# Patient Record
Sex: Male | Born: 1985 | Race: Black or African American | Hispanic: No | Marital: Single | Smoking: Current every day smoker
Health system: Southern US, Community
[De-identification: ages and names within clinical notes are randomized; demographics above are authoritative.]

---
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
## 2013-10-14 ENCOUNTER — Emergency Department: Payer: Self-pay | Admitting: Internal Medicine

## 2013-12-22 ENCOUNTER — Emergency Department: Payer: Self-pay | Admitting: Emergency Medicine

## 2013-12-22 LAB — BASIC METABOLIC PANEL
ANION GAP: 7 (ref 7–16)
BUN: 14 mg/dL (ref 7–18)
CALCIUM: 8.7 mg/dL (ref 8.5–10.1)
CO2: 26 mmol/L (ref 21–32)
CREATININE: 1.14 mg/dL (ref 0.60–1.30)
Chloride: 107 mmol/L (ref 98–107)
EGFR (Non-African Amer.): >60
GLUCOSE: 92 mg/dL (ref 65–99)
Osmolality: 280 (ref 275–301)
Potassium: 3.8 mmol/L (ref 3.5–5.1)
Sodium: 140 mmol/L (ref 136–145)

## 2013-12-22 LAB — CBC
HCT: 44.5 %
HGB: 15.2 g/dL
MCH: 28.9 pg
MCHC: 34.1 g/dL
MCV: 85 fL
Platelet: 240 x10 3/mm 3
RBC: 5.24 x10 6/mm 3
RDW: 13.7 %
WBC: 10.2 x10 3/mm 3

## 2013-12-22 LAB — ETHANOL: Ethanol: 150 mg/dL

## 2014-04-26 ENCOUNTER — Emergency Department: Payer: Self-pay | Admitting: Emergency Medicine

## 2016-01-24 ENCOUNTER — Emergency Department: Payer: Self-pay

## 2016-01-24 ENCOUNTER — Emergency Department
Admission: EM | Admit: 2016-01-24 | Discharge: 2016-01-24 | Disposition: A | Discharge status: Home / Self Care | Payer: Self-pay | Attending: Emergency Medicine | Admitting: Emergency Medicine

## 2016-01-24 ENCOUNTER — Encounter: Payer: Self-pay | Admitting: Emergency Medicine

## 2016-01-24 DIAGNOSIS — R079 Chest pain, unspecified: Secondary | ICD-10-CM | POA: Insufficient documentation

## 2016-01-24 DIAGNOSIS — M545 Low back pain: Secondary | ICD-10-CM | POA: Insufficient documentation

## 2016-01-24 DIAGNOSIS — F17200 Nicotine dependence, unspecified, uncomplicated: Secondary | ICD-10-CM | POA: Insufficient documentation

## 2016-01-24 LAB — BASIC METABOLIC PANEL
Anion gap: 9 (ref 5–15)
BUN: 16 mg/dL (ref 6–20)
CO2: 24 mmol/L (ref 22–32)
Calcium: 9.8 mg/dL (ref 8.9–10.3)
Chloride: 104 mmol/L (ref 101–111)
Creatinine, Ser: 0.96 mg/dL (ref 0.61–1.24)
GFR calc Af Amer: >60 mL/min (ref >60)
GLUCOSE: 109 mg/dL — AB (ref 65–99)
Potassium: 3.6 mmol/L (ref 3.5–5.1)
SODIUM: 137 mmol/L (ref 135–145)

## 2016-01-24 LAB — CBC
HEMATOCRIT: 45.9 % (ref 40.0–52.0)
HEMOGLOBIN: 15.4 g/dL (ref 13.0–18.0)
MCH: 29.2 pg (ref 26.0–34.0)
MCHC: 33.6 g/dL (ref 32.0–36.0)
MCV: 86.7 fL (ref 80.0–100.0)
Platelets: 224 10*3/uL (ref 150–440)
RBC: 5.29 MIL/uL (ref 4.40–5.90)
RDW: 13.6 % (ref 11.5–14.5)
WBC: 8.2 10*3/uL (ref 3.8–10.6)

## 2016-01-24 LAB — TROPONIN I: Troponin I: <0.03 ng/mL (ref <0.03)

## 2016-01-24 MED ORDER — RANITIDINE HCL 150 MG PO TABS: 150.0000 mg | ORAL_TABLET | Freq: Two times a day (BID) | ORAL | 1 refills | Status: AC

## 2016-01-24 NOTE — ED Provider Notes (Signed)
[facility] Emergency Department Provider Note   ____________________________________________   I have reviewed the triage vital signs and the nursing notes.   HISTORY  Chief Complaint Chest Pain and Dizziness   History limited by: Not Limited   HPI Rodney Peck is a 30 y.o. male who presents to the emergency department today with main complaint of chest pain. He describes it as sharp stabbing. It is located in the central chest. It is intermittent. He has not noticed any pattern to it. It is not accompanied by any shortness of breath or diaphoresis. In addition the patient has complaints of high blood pressure. This is also been going on for the past week. He denies any history of high blood pressure. Lastly he complains of right lower back pain. This started today. Denies any lifting today although states he does lift for his work.    History reviewed. No pertinent past medical history.  There are no active problems to display for this patient.   History reviewed. No pertinent surgical history.  Prior to Admission medications   Not on File    Allergies Review of patient's allergies indicates no known allergies.  History reviewed. No pertinent family history.  Social History Social History  Substance Use Topics  . Smoking status: Current Every Day Smoker  . Smokeless tobacco: Never Used  . Alcohol use Yes    Review of Systems  Constitutional: Negative for fever. Cardiovascular: Positive for chest pain. Respiratory: Negative for shortness of breath. Gastrointestinal: Negative for abdominal pain, vomiting and diarrhea. Genitourinary: Negative for dysuria. Musculoskeletal: Positive for right lower back. Skin: Negative for rash. Neurological: Negative for headaches, focal weakness or numbness.   10-point ROS otherwise negative.  ____________________________________________   PHYSICAL EXAM:  VITAL SIGNS: ED Triage Vitals  [01/24/16 2022]  Enc Vitals Group     BP (!) 146/86     Pulse Rate 93     Resp 16     Temp 98.4 F (36.9 C)     Temp Source Oral     SpO2 100 %     Weight 225 lb (102.1 kg)     Height 6\' 1"  (1.854 m)   Constitutional: Alert and oriented. Well appearing and in no distress. Eyes: Conjunctivae are normal. Normal extraocular movements. ENT   Head: Normocephalic and atraumatic.   Nose: No congestion/rhinnorhea.   Mouth/Throat: Mucous membranes are moist.   Neck: No stridor. Hematological/Lymphatic/Immunilogical: No cervical lymphadenopathy. Cardiovascular: Normal rate, regular rhythm.  No murmurs, rubs, or gallops. Respiratory: Normal respiratory effort without tachypnea nor retractions. Breath sounds are clear and equal bilaterally. No wheezes/rales/rhonchi. Gastrointestinal: Soft and nontender. No distention.  Genitourinary: Deferred Musculoskeletal: Normal range of motion in all extremities. No lower extremity edema. Neurologic:  Normal speech and language. No gross focal neurologic deficits are appreciated.  Skin:  Skin is warm, dry and intact. No rash noted. Psychiatric: Mood and affect are normal. Speech and behavior are normal. Patient exhibits appropriate insight and judgment.  ____________________________________________    LABS (pertinent positives/negatives)  Labs Reviewed  BASIC METABOLIC PANEL - Abnormal; Notable for the following:       Result Value   Glucose, Bld 109 (*)    All other components within normal limits  CBC  TROPONIN I     ____________________________________________   EKG  I, Phineas Semen, attending physician, personally viewed and interpreted this EKG  EKG Time: 2033 Rate: 82 Rhythm: normal sinus rhythm Axis: normal Intervals: qtc 371 QRS: narrow ST  changes: no st elevation Impression: normal ekg  ____________________________________________    RADIOLOGY  CXR   IMPRESSION:  Normal chest.      ____________________________________________   PROCEDURES  Procedures  ____________________________________________   INITIAL IMPRESSION / ASSESSMENT AND PLAN / ED COURSE  Pertinent labs & imaging results that were available during my care of the patient were reviewed by me and considered in my medical decision making (see chart for details).  Patient presented to the emergency department today because of concerns for intermittent chest pain primarily. EKG chest x-ray and blood work without any concerning findings per patient did state that he isfood and drink alcohol. I do wonder if part of this could be acid reflux. Will give patient prescription for an acids. In terms of the right lower back pain. I think this likely muscular skeletal strain. Discussed care with patient. ____________________________________________   FINAL CLINICAL IMPRESSION(S) / ED DIAGNOSES  Final diagnoses:  Nonspecific chest pain     Note: This dictation was prepared with Dragon dictation. Any transcriptional errors that result from this process are unintentional    Phineas SemenGraydon Mckinzie Saksa, MD 01/24/16 2304

## 2016-01-24 NOTE — Discharge Instructions (Signed)
Please seek medical attention for any high fevers, chest pain, shortness of breath, change in behavior, persistent vomiting, bloody stool or any other new or concerning symptoms.  

## 2016-01-24 NOTE — ED Triage Notes (Addendum)
Pt ambulatory to triage with steady gait, no distress noted. Pt c/o shooting chest pain, lightheaded and lower back pain x2 days. Pt reports he has checked his BP for the past 2 days and has been hypertensive (159/95). Pt denies chest pain at this time. Pt denies cardiac/hypertension HX.

## 2016-06-26 ENCOUNTER — Emergency Department
Admission: EM | Admit: 2016-06-26 | Discharge: 2016-06-26 | Disposition: A | Discharge status: Home / Self Care | Payer: Self-pay | Attending: Emergency Medicine | Admitting: Emergency Medicine

## 2016-06-26 ENCOUNTER — Encounter: Payer: Self-pay | Admitting: *Deleted

## 2016-06-26 DIAGNOSIS — Y929 Unspecified place or not applicable: Secondary | ICD-10-CM | POA: Insufficient documentation

## 2016-06-26 DIAGNOSIS — Y9389 Activity, other specified: Secondary | ICD-10-CM | POA: Insufficient documentation

## 2016-06-26 DIAGNOSIS — S21111A Laceration without foreign body of right front wall of thorax without penetration into thoracic cavity, initial encounter: Secondary | ICD-10-CM | POA: Insufficient documentation

## 2016-06-26 DIAGNOSIS — Y999 Unspecified external cause status: Secondary | ICD-10-CM | POA: Insufficient documentation

## 2016-06-26 DIAGNOSIS — W503XXA Accidental bite by another person, initial encounter: Secondary | ICD-10-CM

## 2016-06-26 DIAGNOSIS — F17200 Nicotine dependence, unspecified, uncomplicated: Secondary | ICD-10-CM | POA: Insufficient documentation

## 2016-06-26 DIAGNOSIS — Z23 Encounter for immunization: Secondary | ICD-10-CM | POA: Insufficient documentation

## 2016-06-26 DIAGNOSIS — Z79899 Other long term (current) drug therapy: Secondary | ICD-10-CM | POA: Insufficient documentation

## 2016-06-26 MED ORDER — TETANUS-DIPHTHERIA TOXOIDS TD 5-2 LFU IM INJ
0.5000 mL | INJECTION | Freq: Once | INTRAMUSCULAR | Status: DC
  Filled 2016-06-26: qty 0.5

## 2016-06-26 MED ORDER — AMOXICILLIN-POT CLAVULANATE 875-125 MG PO TABS: 1.0000 | ORAL_TABLET | Freq: Two times a day (BID) | ORAL | 0 refills | Status: AC

## 2016-06-26 MED ORDER — TETANUS-DIPHTH-ACELL PERTUSSIS 5-2.5-18.5 LF-MCG/0.5 IM SUSP
0.5000 mL | Freq: Once | INTRAMUSCULAR | Status: AC
  Administered 2016-06-26: 0.5 mL via INTRAMUSCULAR
  Filled 2016-06-26: qty 0.5
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
## 2017-06-10 ENCOUNTER — Emergency Department: Payer: BLUE CROSS/BLUE SHIELD

## 2017-06-10 ENCOUNTER — Emergency Department
Admission: EM | Admit: 2017-06-10 | Discharge: 2017-06-10 | Disposition: A | Discharge status: Home / Self Care | Payer: BLUE CROSS/BLUE SHIELD | Attending: Emergency Medicine | Admitting: Emergency Medicine

## 2017-06-10 ENCOUNTER — Encounter: Payer: Self-pay | Admitting: Emergency Medicine

## 2017-06-10 DIAGNOSIS — Y929 Unspecified place or not applicable: Secondary | ICD-10-CM | POA: Insufficient documentation

## 2017-06-10 DIAGNOSIS — Y9383 Activity, rough housing and horseplay: Secondary | ICD-10-CM | POA: Diagnosis not present

## 2017-06-10 DIAGNOSIS — X500XXA Overexertion from strenuous movement or load, initial encounter: Secondary | ICD-10-CM | POA: Diagnosis not present

## 2017-06-10 DIAGNOSIS — S63279A Dislocation of unspecified interphalangeal joint of unspecified finger, initial encounter: Secondary | ICD-10-CM

## 2017-06-10 DIAGNOSIS — S6991XA Unspecified injury of right wrist, hand and finger(s), initial encounter: Secondary | ICD-10-CM | POA: Diagnosis present

## 2017-06-10 DIAGNOSIS — Y999 Unspecified external cause status: Secondary | ICD-10-CM | POA: Diagnosis not present

## 2017-06-10 DIAGNOSIS — F17200 Nicotine dependence, unspecified, uncomplicated: Secondary | ICD-10-CM | POA: Insufficient documentation

## 2017-06-10 MED ORDER — NAPROXEN 500 MG PO TABS: 500.0000 mg | ORAL_TABLET | Freq: Two times a day (BID) | ORAL | 0 refills | Status: AC

## 2017-06-10 MED ORDER — OXYCODONE HCL 5 MG PO TABS
5.0000 mg | ORAL_TABLET | Freq: Once | ORAL | Status: AC
  Administered 2017-06-10: 5 mg via ORAL
  Filled 2017-06-10: qty 1

## 2017-06-10 NOTE — ED Notes (Signed)
See triage note  States he was playing around and jammed his right ring finger  Positive deformity noted on arrival

## 2017-06-10 NOTE — ED Notes (Signed)
Charge nurse notified of ?dislocation of finger per xray

## 2017-06-10 NOTE — Discharge Instructions (Signed)
Please wear the splint until you follow up with orthopedics. Return to the ER for symptoms of concern if unable to schedule an appointment.

## 2017-06-10 NOTE — ED Notes (Signed)
Patient transported to X-ray 
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
## 2017-06-10 NOTE — ED Triage Notes (Signed)
Patient states that he jammed his right fourth finger. Patient with pain, swelling and obvious deformity to finger.

## 2017-11-16 ENCOUNTER — Emergency Department
Admission: EM | Admit: 2017-11-16 | Discharge: 2017-11-16 | Disposition: A | Discharge status: Home / Self Care | Payer: Managed Care, Other (non HMO) | Attending: Emergency Medicine | Admitting: Emergency Medicine

## 2017-11-16 ENCOUNTER — Other Ambulatory Visit: Payer: Self-pay

## 2017-11-16 DIAGNOSIS — H5789 Other specified disorders of eye and adnexa: Secondary | ICD-10-CM | POA: Diagnosis present

## 2017-11-16 DIAGNOSIS — F17200 Nicotine dependence, unspecified, uncomplicated: Secondary | ICD-10-CM | POA: Diagnosis not present

## 2017-11-16 DIAGNOSIS — Y33XXXA Other specified events, undetermined intent, initial encounter: Secondary | ICD-10-CM | POA: Diagnosis not present

## 2017-11-16 DIAGNOSIS — Y929 Unspecified place or not applicable: Secondary | ICD-10-CM | POA: Insufficient documentation

## 2017-11-16 DIAGNOSIS — Y939 Activity, unspecified: Secondary | ICD-10-CM | POA: Diagnosis not present

## 2017-11-16 DIAGNOSIS — S0501XA Injury of conjunctiva and corneal abrasion without foreign body, right eye, initial encounter: Secondary | ICD-10-CM | POA: Diagnosis not present

## 2017-11-16 DIAGNOSIS — Y998 Other external cause status: Secondary | ICD-10-CM | POA: Insufficient documentation

## 2017-11-16 MED ORDER — KETOROLAC TROMETHAMINE 0.5 % OP SOLN: 1.0000 [drp] | Freq: Four times a day (QID) | OPHTHALMIC | 0 refills | Status: AC

## 2017-11-16 MED ORDER — ERYTHROMYCIN 5 MG/GM OP OINT: 1.0000 "application " | TOPICAL_OINTMENT | Freq: Four times a day (QID) | OPHTHALMIC | 0 refills | Status: AC

## 2017-11-16 MED ORDER — FLUORESCEIN SODIUM 1 MG OP STRP
ORAL_STRIP | OPHTHALMIC | Status: AC
  Administered 2017-11-16: 16:00:00
  Filled 2017-11-16: qty 1

## 2017-11-16 MED ORDER — TETRACAINE HCL 0.5 % OP SOLN
OPHTHALMIC | Status: AC
  Administered 2017-11-16: 16:00:00
  Filled 2017-11-16: qty 4

## 2017-11-16 NOTE — ED Triage Notes (Signed)
Pt alert, oriented, ambulatory. Red and drainage to R eye since this AM.

## 2017-11-16 NOTE — ED Provider Notes (Signed)
[facility] Emergency Department Provider Note ____________________________________________  Time seen: Approximately 3:42 PM  I have reviewed the triage vital signs and the nursing notes.   HISTORY  Chief Complaint Eye Problem   HPI Rodney Peck is a 32 y.o. male who presents to the emergency department for treatment and evaluation of redness and drainage to the right eye since this morning. Patient states he awakened with the redness and watery drainage. He feels that there is something in his eye. He does not wear contact lenses. He does not recall feeling eye irritation before bed. No alleviating measures attempted prior to arrival.  History reviewed. No pertinent past medical history.  There are no active problems to display for this patient.   History reviewed. No pertinent surgical history.  Prior to Admission medications   Medication Sig Start Date End Date Taking? Authorizing Provider  erythromycin ophthalmic ointment Place 1 application into the right eye 4 (four) times daily. 11/16/17   Jeralyn Nolden, Rulon Eisenmengerari B, FNP  ketorolac (ACULAR) 0.5 % ophthalmic solution Place 1 drop into the right eye 4 (four) times daily. 11/16/17   Lorelai Huyser, Rulon Eisenmengerari B, FNP  naproxen (NAPROSYN) 500 MG tablet Take 1 tablet (500 mg total) by mouth 2 (two) times daily with a meal. 06/10/17   Teila Skalsky B, FNP  ranitidine (ZANTAC) 150 MG tablet Take 1 tablet (150 mg total) by mouth 2 (two) times daily. 01/24/16 01/23/17  Phineas SemenGoodman, Graydon, MD    Allergies Patient has no known allergies.  History reviewed. No pertinent family history.  Social History Social History   Tobacco Use  . Smoking status: Current Every Day Smoker  . Smokeless tobacco: Never Used  Substance Use Topics  . Alcohol use: Yes  . Drug use: No    Review of Systems   Constitutional: No fever/chills Eyes: Negative for visual changes. Positive for pain. Positive for drainage. Musculoskeletal: Negative for  pain. Skin: Negative for rash. Neurological: Negative for headaches, focal weakness or numbness. Allergic: Negative for seasonal allergies. ____________________________________________  PHYSICAL EXAM:  VITAL SIGNS: ED Triage Vitals  Enc Vitals Group     BP 11/16/17 1531 (!) 154/74     Pulse Rate 11/16/17 1531 97     Resp 11/16/17 1531 18     Temp 11/16/17 1531 98.7 F (37.1 C)     Temp Source 11/16/17 1531 Oral     SpO2 11/16/17 1531 97 %     Weight 11/16/17 1532 222 lb (100.7 kg)     Height 11/16/17 1532 6\' 1"  (1.854 m)     Head Circumference --      Peak Flow --      Pain Score 11/16/17 1532 5     Pain Loc --      Pain Edu? --      Excl. in GC? --     Constitutional: Alert and oriented. Well appearing and in no acute distress. Eyes: Visual acuity--see nursing documentation; no globe trauma; Eyelids normal to inspection; Sclera appears anicteric.  Eyelids were inverted. Conjunctiva appears injected (R eye); Cornea: linear abrasion along the lower conjunctiva overlying the iris from about the 5 o'clock position to the 7 o'clock position.  Head: Atraumatic. Nose: No congestion/rhinnorhea. Mouth/Throat: Mucous membranes are moist.  Oropharynx non-erythematous. Respiratory: Respirations even and unlabored. Breath sounds clear to auscultation. Musculoskeletal:Normal ROM x 4 extremities. Neurologic:  Normal speech and language. No gross focal neurologic deficits are appreciated. Speech is normal. No gait instability. Skin:  Skin is warm,  dry and intact. No rash noted. Psychiatric: Mood and affect are normal. Speech and behavior are normal.  ____________________________________________   LABS (all labs ordered are listed, but only abnormal results are displayed)  Labs Reviewed - No data to display ____________________________________________  EKG  Not indicated ____________________________________________  RADIOLOGY  Not  indicated ____________________________________________   PROCEDURES  Procedure(s) performed: None ____________________________________________   INITIAL IMPRESSION / ASSESSMENT AND PLAN / ED COURSE  32 year old male presenting to the emergency department for treatment and evaluation of right eye irritation. Exam reveals corneal abrasion. He will be prescribed erythromycin ophthalmic ointment and Acular. He is to follow up with ophthalmology if not improving over the next 48 hours. He is to return to the ER for symptoms that change or worsen if unable to schedule an appointment.  Pertinent labs & imaging results that were available during my care of the patient were reviewed by me and considered in my medical decision making (see chart for details). ____________________________________________   FINAL CLINICAL IMPRESSION(S) / ED DIAGNOSES  Final diagnoses:  Abrasion of right cornea, initial encounter    Note:  This document was prepared using Dragon voice recognition software and may include unintentional dictation errors.    Chinita Pester, FNP 11/16/17 1630    Arnaldo Natal, MD 11/16/17 2119

## 2018-09-29 ENCOUNTER — Encounter: Payer: Self-pay | Admitting: Emergency Medicine

## 2018-09-29 ENCOUNTER — Other Ambulatory Visit: Payer: Self-pay

## 2018-09-29 ENCOUNTER — Emergency Department: Payer: Managed Care, Other (non HMO)

## 2018-09-29 ENCOUNTER — Emergency Department
Admission: EM | Admit: 2018-09-29 | Discharge: 2018-09-29 | Disposition: A | Discharge status: Home / Self Care | Payer: Managed Care, Other (non HMO) | Attending: Emergency Medicine | Admitting: Emergency Medicine

## 2018-09-29 DIAGNOSIS — Y999 Unspecified external cause status: Secondary | ICD-10-CM | POA: Diagnosis not present

## 2018-09-29 DIAGNOSIS — F17210 Nicotine dependence, cigarettes, uncomplicated: Secondary | ICD-10-CM | POA: Insufficient documentation

## 2018-09-29 DIAGNOSIS — X58XXXA Exposure to other specified factors, initial encounter: Secondary | ICD-10-CM | POA: Diagnosis not present

## 2018-09-29 DIAGNOSIS — Y929 Unspecified place or not applicable: Secondary | ICD-10-CM | POA: Insufficient documentation

## 2018-09-29 DIAGNOSIS — Y9389 Activity, other specified: Secondary | ICD-10-CM | POA: Insufficient documentation

## 2018-09-29 DIAGNOSIS — Z79899 Other long term (current) drug therapy: Secondary | ICD-10-CM | POA: Diagnosis not present

## 2018-09-29 DIAGNOSIS — S6992XA Unspecified injury of left wrist, hand and finger(s), initial encounter: Secondary | ICD-10-CM | POA: Diagnosis not present

## 2018-09-29 MED ORDER — KETOROLAC TROMETHAMINE 30 MG/ML IJ SOLN
30.0000 mg | Freq: Once | INTRAMUSCULAR | Status: AC
Start: 2018-09-29 — End: 2018-09-29
  Administered 2018-09-29: 23:00:00 30 mg via INTRAMUSCULAR
  Filled 2018-09-29: qty 1

## 2018-09-29 MED ORDER — IBUPROFEN 600 MG PO TABS: 600.0000 mg | ORAL_TABLET | Freq: Four times a day (QID) | ORAL | 0 refills | Status: AC | PRN

## 2018-09-29 NOTE — ED Triage Notes (Signed)
Patient ambulatory to triage with steady gait, without difficulty or distress noted, mask in place; pt reports fell last week injuring left thumb

## 2018-09-29 NOTE — ED Provider Notes (Signed)
[facility] Emergency Department Provider Note  ____________________________________________  Time seen: Approximately 11:19 PM  I have reviewed the triage vital signs and the nursing notes.   HISTORY  Chief Complaint Finger Injury    HPI Rodney Peck is a 33 y.o. male that presents to the emergency department for evaluation of left thumb pain for 1 week.  Swelling has improved.  Patient states that he was helping his grandma ambulate after she had a stroke when he injured his left thumb.  He is not sure which way his finger bent.  He has continued to work all week and thinks this is not helping injury.  No alleviating measures have been attempted.  No additional injuries.  No numbness, tingling.   History reviewed. No pertinent past medical history.  There are no active problems to display for this patient.   History reviewed. No pertinent surgical history.  Prior to Admission medications   Medication Sig Start Date End Date Taking? Authorizing Provider  erythromycin ophthalmic ointment Place 1 application into the right eye 4 (four) times daily. 11/16/17   Triplett, Cari B, FNP  ibuprofen (ADVIL) 600 MG tablet Take 1 tablet (600 mg total) by mouth every 6 (six) hours as needed. 09/29/18   Laban Emperor, PA-C  ketorolac (ACULAR) 0.5 % ophthalmic solution Place 1 drop into the right eye 4 (four) times daily. 11/16/17   Triplett, Johnette Abraham B, FNP  naproxen (NAPROSYN) 500 MG tablet Take 1 tablet (500 mg total) by mouth 2 (two) times daily with a meal. 06/10/17   Triplett, Cari B, FNP  ranitidine (ZANTAC) 150 MG tablet Take 1 tablet (150 mg total) by mouth 2 (two) times daily. 01/24/16 01/23/17  Nance Pear, MD    Allergies Patient has no known allergies.  No family history on file.  Social History Social History   Tobacco Use  . Smoking status: Current Every Day Smoker  . Smokeless tobacco: Never Used  Substance Use Topics  . Alcohol use: Yes  . Drug  use: No     Review of Systems  Respiratory: No SOB. Gastrointestinal: No abdominal pain.  No nausea, no vomiting.  Musculoskeletal: Positive for finger pain. Skin: Negative for rash, abrasions, lacerations, ecchymosis. Neurological: Negative for numbness or tingling   ____________________________________________   PHYSICAL EXAM:  VITAL SIGNS: ED Triage Vitals [09/29/18 2209]  Enc Vitals Group     BP (!) 158/92     Pulse Rate 100     Resp 18     Temp 98.3 F (36.8 C)     Temp Source Oral     SpO2 98 %     Weight 230 lb (104.3 kg)     Height 6\' 1"  (1.854 m)     Head Circumference      Peak Flow      Pain Score 7     Pain Loc      Pain Edu?      Excl. in [place]?      Constitutional: Alert and oriented. Well appearing and in no acute distress. Eyes: Conjunctivae are normal. PERRL. EOMI. Head: Atraumatic. ENT:      Ears:      Nose: No congestion/rhinnorhea.      Mouth/Throat: Mucous membranes are moist.  Neck: No stridor. Cardiovascular: Normal rate, regular rhythm.  Good peripheral circulation. Respiratory: Normal respiratory effort without tachypnea or retractions. Lungs CTAB. Good air entry to the bases with no decreased or absent breath sounds. Musculoskeletal: Full range of  motion to all extremities. No gross deformities appreciated.  Full range of motion of left thumb. Neurologic:  Normal speech and language. No gross focal neurologic deficits are appreciated.  Skin:  Skin is warm, dry and intact. No rash noted. Psychiatric: Mood and affect are normal. Speech and behavior are normal. Patient exhibits appropriate insight and judgement.   ____________________________________________   LABS (all labs ordered are listed, but only abnormal results are displayed)  Labs Reviewed - No data to display ____________________________________________  EKG   ____________________________________________  RADIOLOGY Lexine BatonI, Rodney Yera, personally viewed and evaluated  these images (plain radiographs) as part of my medical decision making, as well as reviewing the written report by the radiologist.  Dg Finger Thumb Left  Result Date: 09/29/2018 CLINICAL DATA:  33 year old male status post fall last week with left thumb injury. EXAM: LEFT THUMB 2+V COMPARISON:  None. FINDINGS: Bone mineralization is within normal limits. There is no evidence of fracture or dislocation. There is no evidence of arthropathy or other focal bone abnormality. No discrete soft tissue injury. IMPRESSION: Negative. Electronically Signed   By: Odessa FlemingH  Hall M.D.   On: 09/29/2018 22:35    ____________________________________________    PROCEDURES  Procedure(s) performed:    Procedures    Medications  ketorolac (TORADOL) 30 MG/ML injection 30 mg (30 mg Intramuscular Given 09/29/18 2311)     ____________________________________________   INITIAL IMPRESSION / ASSESSMENT AND PLAN / ED COURSE  Pertinent labs & imaging results that were available during my care of the patient were reviewed by me and considered in my medical decision making (see chart for details).  Review of the [place] CSRS was performed in accordance of the [facility] prior to dispensing any controlled drugs.     Patient's diagnosis is consistent with finger sprain.  Vital signs and exam are reassuring.  X-ray negative for acute bony abnormalities.  Finger was Ace wrapped.  IM Toradol was given for pain.  Patient will be discharged home with prescriptions for Motrin. Patient is to follow up with primary care as directed. Patient is given ED precautions to return to the ED for any worsening or new symptoms.     ____________________________________________  FINAL CLINICAL IMPRESSION(S) / ED DIAGNOSES  Final diagnoses:  Injury of left thumb, initial encounter      NEW MEDICATIONS STARTED DURING THIS VISIT:  ED Discharge Orders         Ordered    ibuprofen (ADVIL) 600 MG tablet  Every 6 hours PRN     09/29/18 2315               This chart was dictated using voice recognition software/Dragon. Despite best efforts to proofread, errors can occur which can change the meaning. Any change was purely unintentional.    Enid DerryWagner, Tawanna Funk, PA-C 09/29/18 2323    Sharman CheekStafford, Phillip, MD 10/01/18 1435

## 2018-09-29 NOTE — Discharge Instructions (Addendum)
There is no fracture on your finger x-ray.  Your finger is sprained.  Please wear Ace wrap and limit use of finger.  Please take ibuprofen for inflammation.

## 2018-10-20 ENCOUNTER — Telehealth: Payer: Self-pay | Admitting: *Deleted

## 2018-10-20 DIAGNOSIS — Z20822 Contact with and (suspected) exposure to covid-19: Secondary | ICD-10-CM

## 2018-10-20 NOTE — Telephone Encounter (Signed)
Pt referred for testing for covid-19 by the [facility]. Pt is scheduled for tomorrow July 1 st at 10 am at the [facility] in [place]. Advised that this is a drive thru test site and to wear a mask, stay in car with windows rolled up and ready for testing. Pt voiced understanding.

## 2018-10-21 ENCOUNTER — Other Ambulatory Visit: Payer: Managed Care, Other (non HMO)

## 2018-10-21 DIAGNOSIS — Z20822 Contact with and (suspected) exposure to covid-19: Secondary | ICD-10-CM

## 2018-10-28 LAB — NOVEL CORONAVIRUS, NAA: SARS-CoV-2, NAA: NOT DETECTED

## 2021-01-17 IMAGING — DX LEFT THUMB 2+V
3 series · 3 of 3 positions shown · non-contrast
Comparison: None.

CLINICAL DATA: 32-year-old male status post fall last week with
left thumb injury.

EXAM:
LEFT THUMB 2+V

[finger ap]
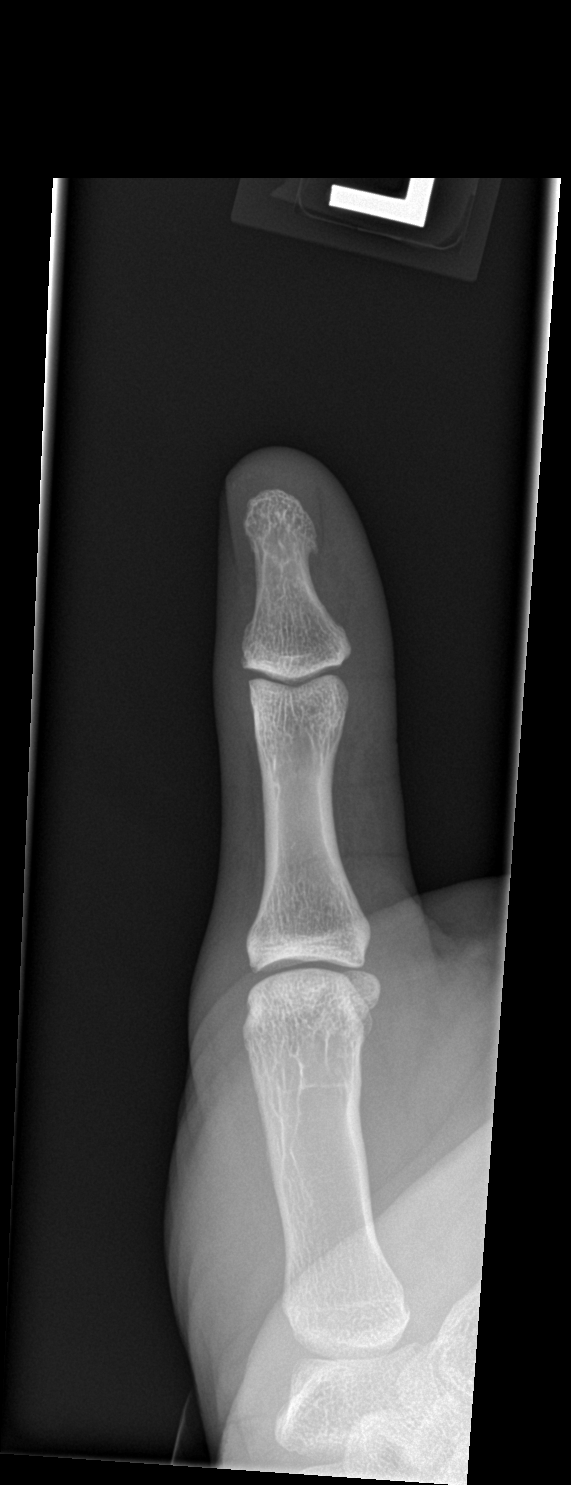

[finger obl]
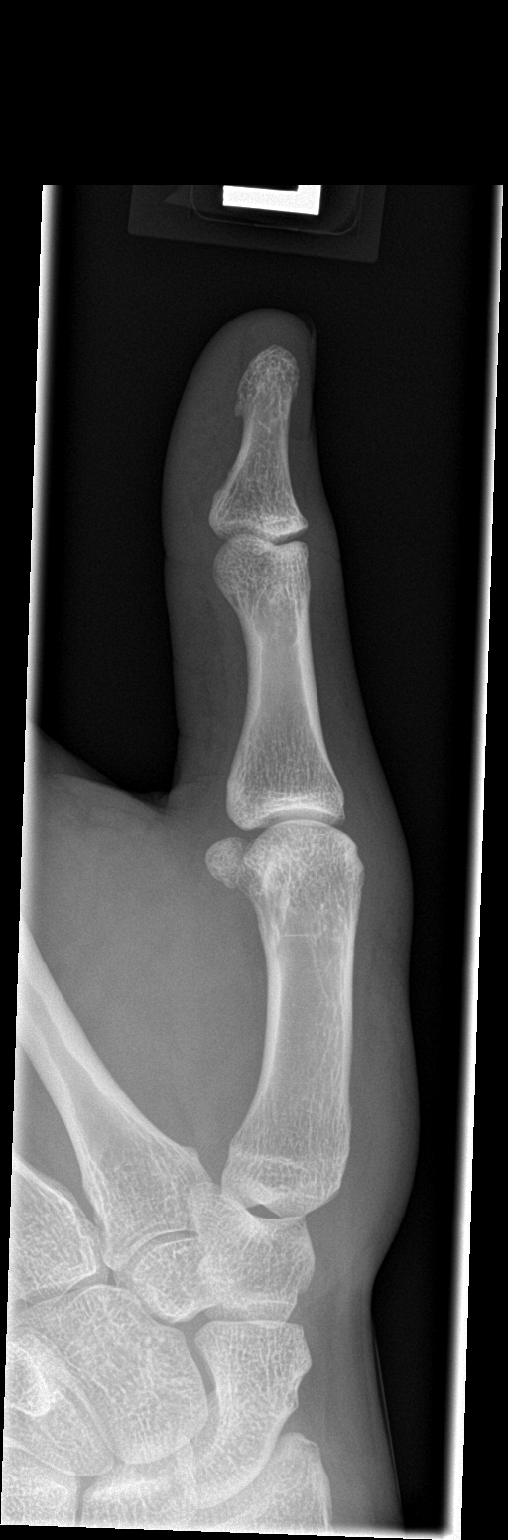

[finger lat]
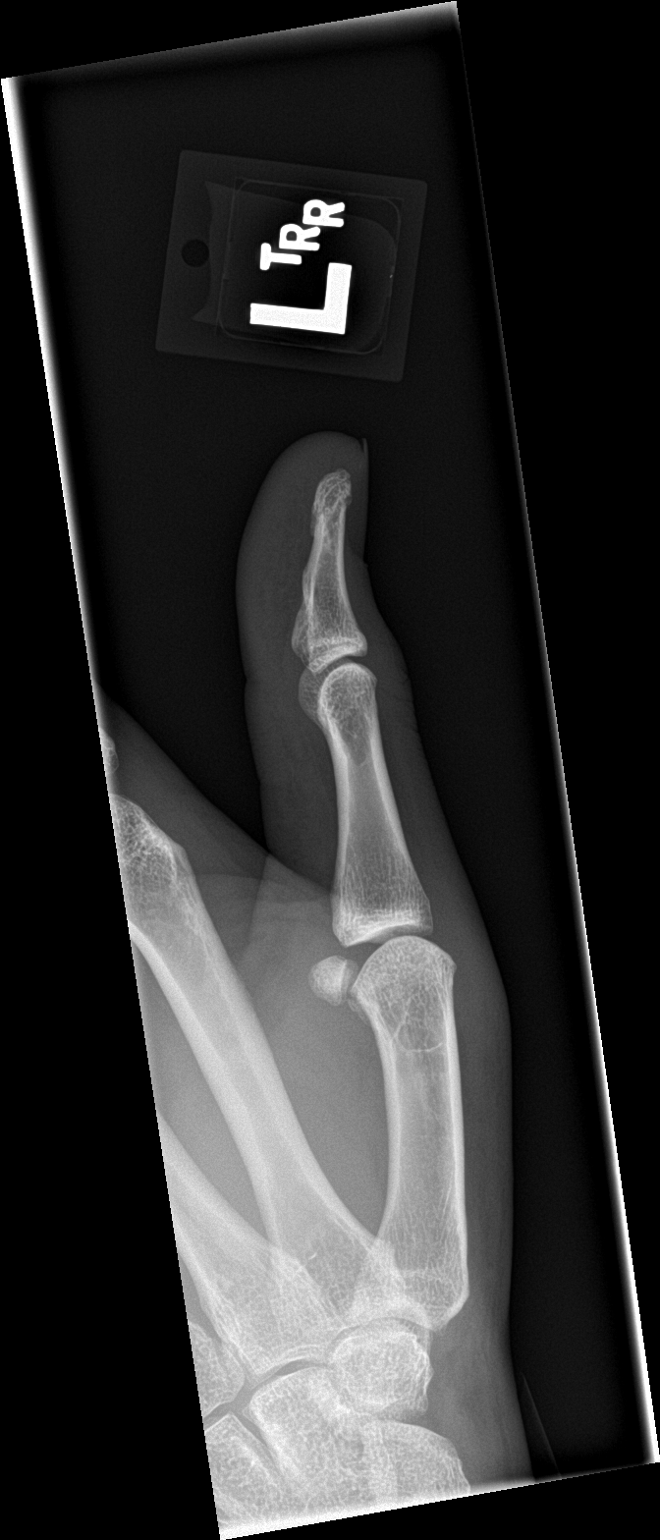

[3 of 3 positions shown; findings below may reference images not displayed]

FINDINGS: Bone mineralization is within normal limits. There is no evidence of
fracture or dislocation. There is no evidence of arthropathy or
other focal bone abnormality. No discrete soft tissue injury.
IMPRESSION: Negative.

## 2021-04-18 ENCOUNTER — Emergency Department
Admission: EM | Admit: 2021-04-18 | Discharge: 2021-04-18 | Disposition: A | Discharge status: Home / Self Care | Payer: 59 | Attending: Emergency Medicine | Admitting: Emergency Medicine

## 2021-04-18 ENCOUNTER — Other Ambulatory Visit: Payer: Self-pay

## 2021-04-18 DIAGNOSIS — H60392 Other infective otitis externa, left ear: Secondary | ICD-10-CM | POA: Diagnosis not present

## 2021-04-18 DIAGNOSIS — F17200 Nicotine dependence, unspecified, uncomplicated: Secondary | ICD-10-CM | POA: Diagnosis not present

## 2021-04-18 DIAGNOSIS — H9202 Otalgia, left ear: Secondary | ICD-10-CM | POA: Diagnosis present

## 2021-04-18 MED ORDER — METRONIDAZOLE 500 MG PO TABS
2000.0000 mg | ORAL_TABLET | Freq: Once | ORAL | Status: AC
Start: 2021-04-18 — End: 2021-04-18
  Administered 2021-04-18: 18:00:00 2000 mg via ORAL
  Filled 2021-04-18: qty 4

## 2021-04-18 MED ORDER — CIPROFLOXACIN HCL 500 MG PO TABS: 500.0000 mg | ORAL_TABLET | Freq: Two times a day (BID) | ORAL | 0 refills | Status: AC

## 2021-04-18 NOTE — ED Notes (Signed)
Patient discharged to home per MD order. Patient in stable condition, and deemed medically cleared by ED provider for discharge. Discharge instructions reviewed with patient/family using "Teach Back"; verbalized understanding of medication education and administration, and information about follow-up care. Denies further concerns. ° °

## 2021-04-18 NOTE — ED Triage Notes (Signed)
Pt here with left ear pain for 1 week. Pt states that he now has swelling on the left side of his face with pain. Pt in NAD in triage.

## 2021-04-18 NOTE — ED Provider Notes (Signed)
[facility] Emergency Department Provider Note    ____________________________________________   Event Date/Time   First MD Initiated Contact with Patient 04/18/21 1643     (approximate)  I have reviewed the triage vital signs and the nursing notes.   HISTORY  Chief Complaint Otalgia   HPI Rodney Peck is a 35 y.o. male presenting to the emergency department for evaluation of ear pain.  Patient states that he was recently at the beach by a week ago.  Since returning, he has had pain in his left ear.  Reports having significant swelling inside the ear canal, as well as clear/yellow drainage.  Additionally, he reports a recent trichomonas exposure from his girlfriend and is requesting treatment. Denies fever/chills, cough, sinus congestion, hearing loss, chest pain, shortness of breath, abdominal pain, or urinary symptoms.  History limited by: No limitations.  No past medical history on file.  There are no problems to display for this patient.   No past surgical history on file.  Prior to Admission medications   Medication Sig Start Date End Date Taking? Authorizing Provider  ciprofloxacin (CIPRO) 500 MG tablet Take 1 tablet (500 mg total) by mouth 2 (two) times daily for 10 days. 04/18/21 04/28/21 Yes Teodoro Spray, PA  erythromycin ophthalmic ointment Place 1 application into the right eye 4 (four) times daily. 11/16/17   Triplett, Cari B, FNP  ibuprofen (ADVIL) 600 MG tablet Take 1 tablet (600 mg total) by mouth every 6 (six) hours as needed. 09/29/18   Laban Emperor, PA-C  ketorolac (ACULAR) 0.5 % ophthalmic solution Place 1 drop into the right eye 4 (four) times daily. 11/16/17   Triplett, Johnette Abraham B, FNP  naproxen (NAPROSYN) 500 MG tablet Take 1 tablet (500 mg total) by mouth 2 (two) times daily with a meal. 06/10/17   Triplett, Cari B, FNP  ranitidine (ZANTAC) 150 MG tablet Take 1 tablet (150 mg total) by mouth 2 (two) times daily. 01/24/16 01/23/17   Nance Pear, MD    Allergies Patient has no known allergies.  No family history on file.  Social History Social History   Tobacco Use   Smoking status: Every Day   Smokeless tobacco: Never  Substance Use Topics   Alcohol use: Yes   Drug use: No    Review of Systems Review of Systems  Constitutional:  Negative for chills and fever.  HENT:  Positive for ear discharge and ear pain. Negative for sore throat.   Eyes:  Negative for blurred vision.  Respiratory:  Negative for sputum production and shortness of breath.   Cardiovascular:  Negative for chest pain and leg swelling.  Gastrointestinal:  Negative for abdominal pain and vomiting.  Genitourinary:  Negative for dysuria, flank pain and hematuria.  Musculoskeletal:  Negative for myalgias.  Skin:  Negative for rash.  Neurological:  Negative for headaches.    10-point ROS otherwise negative. ____________________________________________   PHYSICAL EXAM:  VITAL SIGNS: ED Triage Vitals  Enc Vitals Group     BP 04/18/21 1539 125/78     Pulse Rate 04/18/21 1539 66     Resp 04/18/21 1539 16     Temp 04/18/21 1539 98 F (36.7 C)     Temp Source 04/18/21 1539 Oral     SpO2 04/18/21 1539 97 %     Weight 04/18/21 1540 230 lb (104.3 kg)     Height 04/18/21 1540 6\' 1"  (1.854 m)     Head Circumference --  Peak Flow --      Pain Score 04/18/21 1540 8     Pain Loc --      Pain Edu? --      Excl. in [place]? --     Physical Exam Constitutional:      General: He is not in acute distress.    Appearance: Normal appearance. He is not ill-appearing.  HENT:     Head: Normocephalic.     Right Ear: Tympanic membrane, ear canal and external ear normal.     Ears:     Comments: Significant edema in external ear canal.  Tenderness during exam.  Erythema present.  Some yellow drainage.  TM is intact.  Preauricular lymphadenopathy present.    Nose: Nose normal.     Mouth/Throat:     Mouth: Mucous membranes are moist.      Pharynx: Oropharynx is clear.  Eyes:     Conjunctiva/sclera: Conjunctivae normal.     Pupils: Pupils are equal, round, and reactive to light.  Cardiovascular:     Rate and Rhythm: Normal rate and regular rhythm.  Pulmonary:     Effort: Pulmonary effort is normal. No respiratory distress.     Breath sounds: Normal breath sounds. No wheezing, rhonchi or rales.  Abdominal:     General: Abdomen is flat. There is no distension.     Palpations: Abdomen is soft.  Musculoskeletal:        General: Normal range of motion.     Cervical back: Normal range of motion and neck supple.  Skin:    General: Skin is warm and dry.  Neurological:     General: No focal deficit present.     Mental Status: He is alert. Mental status is at baseline.  Psychiatric:        Mood and Affect: Mood normal.        Behavior: Behavior normal.        Thought Content: Thought content normal.        Judgment: Judgment normal.     ____________________________________________    LABS  (all labs ordered are listed, but only abnormal results are displayed)  Labs Reviewed - No data to display   ____________________________________________   EKG Not applicable.   ____________________________________________    RADIOLOGY I personally viewed and evaluated these images as part of my medical decision making, as well as reviewing the written report by the radiologist.  ED Provider Interpretation: Not applicable.  No results found.  ____________________________________________   PROCEDURES  Procedures   Medications  metroNIDAZOLE (FLAGYL) tablet 2,000 mg (2,000 mg Oral Given 04/18/21 1817)    Critical Care performed: No  ____________________________________________   INITIAL IMPRESSION / ASSESSMENT AND PLAN / ED COURSE  Pertinent labs & imaging results that were available during my care of the patient were reviewed by me and considered in my medical decision making (see chart for details).        Rodney Peck is a 35 y.o. male, presenting to the emergency department for evaluation of ear pain.  Patient states that he was recently at the beach by a week ago.  Since returning, he has had pain in his left ear.  Reports having significant swelling inside the ear canal, as well as clear/yellow drainage.  Additionally, he reports a recent trichomonas exposure from his girlfriend and is requesting treatment. Denies fever/chills, cough, sinus congestion, hearing loss, chest pain, shortness of breath, abdominal pain, or urinary symptoms.  Patient appears well.  Physical exam  notable for significant edema in external ear canal. Tenderness during exam. Erythema present.  Some yellow drainage.TM is intact.  Preauricular lymphadenopathy present.  Given the patient's history and physical exam, I suspect that the patient is likely experiencing otitis externa.  Mastoiditis not suspected.  TM is intact.  We will go ahead and provide a one-time dose of metronidazole here in the emergency department for the patient's trichomonas exposure.  We will then discharge the patient with a prescription for ciprofloxacin for his ear infection.  Given significant edema in the ear canal, we will opt for p.o. antibiotic.  Patient was provided with anticipatory guidance and strict return precautions.  Advised the patient to return to the emergency department anytime if you begin to experience any new or worsening symptoms.       ____________________________________________   FINAL CLINICAL IMPRESSION(S) / ED DIAGNOSES  Final diagnoses:  Other infective acute otitis externa of left ear     NEW MEDICATIONS STARTED DURING THIS VISIT:  ED Discharge Orders          Ordered    ciprofloxacin (CIPRO) 500 MG tablet  2 times daily        04/18/21 1816             Note:  This document was prepared using Dragon voice recognition software and may include unintentional dictation errors.    Teodoro Spray, [place] 04/18/21 2031    Vladimir Crofts, MD 04/19/21 1901

## 2024-02-21 ENCOUNTER — Emergency Department
Admission: EM | Admit: 2024-02-21 | Discharge: 2024-02-21 | Disposition: A | Discharge status: Home / Self Care | Payer: Self-pay | Attending: Emergency Medicine | Admitting: Emergency Medicine

## 2024-02-21 ENCOUNTER — Other Ambulatory Visit: Payer: Self-pay

## 2024-02-21 DIAGNOSIS — H6022 Malignant otitis externa, left ear: Secondary | ICD-10-CM | POA: Insufficient documentation

## 2024-02-21 MED ORDER — PREDNISONE 50 MG PO TABS: ORAL_TABLET | ORAL | 0 refills | Status: AC

## 2024-02-21 MED ORDER — OFLOXACIN 0.3 % OT SOLN: 5.0000 [drp] | Freq: Every day | OTIC | 0 refills | Status: AC
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
## 2024-02-21 NOTE — Discharge Instructions (Addendum)
 You have been diagnosed with left otitis externa.  Please apply ofloxacin 5 drops in the left ear daily.  Please take prednisone 1 tablet with breakfast for the next 5 days.  Please call ENT and make an appointment for a follow-up.  Please come back to ED with your PCP if you have new symptoms or symptoms worsen.  It was a pleasure to help you today.  Jayvien Rowlette, PA-C

## 2024-02-21 NOTE — ED Triage Notes (Signed)
 Pt to ED via POV from home. Pt reports has been having ongoing left ear pain since the beginning of summer and states feels like there is fluid in his ear. Pt reports never received called from ENT office.
